# Patient Record
Sex: Female | Born: 1970 | Race: White | Hispanic: No | Marital: Married | Smoking: Former smoker
Health system: Southern US, Community
[De-identification: ages and names within clinical notes are randomized; demographics above are authoritative.]

## PROBLEM LIST (undated history)

## (undated) DIAGNOSIS — N189 Chronic kidney disease, unspecified: Secondary | ICD-10-CM

## (undated) DIAGNOSIS — E669 Obesity, unspecified: Secondary | ICD-10-CM

## (undated) DIAGNOSIS — K759 Inflammatory liver disease, unspecified: Secondary | ICD-10-CM

## (undated) DIAGNOSIS — K649 Unspecified hemorrhoids: Secondary | ICD-10-CM

## (undated) DIAGNOSIS — I10 Essential (primary) hypertension: Secondary | ICD-10-CM

## (undated) DIAGNOSIS — T8859XA Other complications of anesthesia, initial encounter: Secondary | ICD-10-CM

## (undated) DIAGNOSIS — D649 Anemia, unspecified: Secondary | ICD-10-CM

## (undated) DIAGNOSIS — R42 Dizziness and giddiness: Secondary | ICD-10-CM

## (undated) DIAGNOSIS — T4145XA Adverse effect of unspecified anesthetic, initial encounter: Secondary | ICD-10-CM

## (undated) DIAGNOSIS — F419 Anxiety disorder, unspecified: Secondary | ICD-10-CM

## (undated) HISTORY — PX: VESICOVAGINAL FISTULA CLOSURE: SUR270

## (undated) HISTORY — PX: CYSTOURETHROSCOPY: SHX476

## (undated) HISTORY — PX: COLONOSCOPY: SHX174

---

## 2006-12-21 ENCOUNTER — Ambulatory Visit: Payer: Self-pay | Admitting: Emergency Medicine

## 2006-12-26 ENCOUNTER — Ambulatory Visit: Payer: Self-pay | Admitting: Internal Medicine

## 2009-06-13 ENCOUNTER — Ambulatory Visit: Payer: Self-pay | Admitting: Family Medicine

## 2009-07-09 ENCOUNTER — Ambulatory Visit: Payer: Self-pay | Admitting: Internal Medicine

## 2011-06-02 ENCOUNTER — Ambulatory Visit: Payer: Self-pay | Admitting: Internal Medicine

## 2013-06-14 HISTORY — PX: ABDOMINAL HYSTERECTOMY: SHX81

## 2013-08-03 ENCOUNTER — Ambulatory Visit: Payer: Self-pay | Admitting: Internal Medicine

## 2014-01-23 ENCOUNTER — Ambulatory Visit: Payer: Self-pay | Admitting: Internal Medicine

## 2014-04-16 ENCOUNTER — Ambulatory Visit: Payer: Self-pay | Admitting: Obstetrics and Gynecology

## 2014-04-16 DIAGNOSIS — I10 Essential (primary) hypertension: Secondary | ICD-10-CM

## 2014-04-16 LAB — CBC
HCT: 35 % (ref 35.0–47.0)
HGB: 11.5 g/dL — ABNORMAL LOW (ref 12.0–16.0)
MCH: 25.4 pg — AB (ref 26.0–34.0)
MCHC: 32.9 g/dL (ref 32.0–36.0)
MCV: 77 fL — ABNORMAL LOW (ref 80–100)
Platelet: 226 10*3/uL (ref 150–440)
RBC: 4.53 10*6/uL (ref 3.80–5.20)
RDW: 15.9 % — ABNORMAL HIGH (ref 11.5–14.5)
WBC: 8.9 10*3/uL (ref 3.6–11.0)

## 2014-04-16 LAB — BASIC METABOLIC PANEL
Anion Gap: 6 — ABNORMAL LOW (ref 7–16)
BUN: 10 mg/dL (ref 7–18)
Calcium, Total: 8.8 mg/dL (ref 8.5–10.1)
Chloride: 103 mmol/L (ref 98–107)
Co2: 30 mmol/L (ref 21–32)
Creatinine: 0.73 mg/dL (ref 0.60–1.30)
EGFR (African American): >60
Glucose: 89 mg/dL (ref 65–99)
OSMOLALITY: 276 (ref 275–301)
POTASSIUM: 3.4 mmol/L — AB (ref 3.5–5.1)
Sodium: 139 mmol/L (ref 136–145)

## 2014-04-22 ENCOUNTER — Ambulatory Visit: Payer: Self-pay | Admitting: Obstetrics and Gynecology

## 2014-04-23 LAB — CREATININE, SERUM
CREATININE: 0.82 mg/dL (ref 0.60–1.30)
EGFR (African American): >60
EGFR (Non-African Amer.): >60

## 2014-04-23 LAB — HEMOGLOBIN: HGB: 9 g/dL — AB (ref 12.0–16.0)

## 2014-04-24 ENCOUNTER — Emergency Department: Payer: Self-pay | Admitting: Emergency Medicine

## 2014-04-24 LAB — URINALYSIS, COMPLETE
BILIRUBIN, UR: NEGATIVE
Bacteria: NONE SEEN
GLUCOSE, UR: NEGATIVE mg/dL (ref 0–75)
Leukocyte Esterase: NEGATIVE
Nitrite: POSITIVE
PH: 6 (ref 4.5–8.0)
Specific Gravity: 1.023 (ref 1.003–1.030)
Squamous Epithelial: <1

## 2014-04-24 LAB — CBC
HCT: 31.4 % — AB (ref 35.0–47.0)
HGB: 10.2 g/dL — ABNORMAL LOW (ref 12.0–16.0)
MCH: 25.3 pg — AB (ref 26.0–34.0)
MCHC: 32.6 g/dL (ref 32.0–36.0)
MCV: 77 fL — ABNORMAL LOW (ref 80–100)
Platelet: 222 10*3/uL (ref 150–440)
RBC: 4.06 10*6/uL (ref 3.80–5.20)
RDW: 15.7 % — ABNORMAL HIGH (ref 11.5–14.5)
WBC: 12 10*3/uL — ABNORMAL HIGH (ref 3.6–11.0)

## 2014-04-24 LAB — COMPREHENSIVE METABOLIC PANEL
ALBUMIN: 2.8 g/dL — AB (ref 3.4–5.0)
ALT: 21 U/L
AST: 23 U/L (ref 15–37)
Alkaline Phosphatase: 68 U/L
Anion Gap: 8 (ref 7–16)
BUN: 16 mg/dL (ref 7–18)
Bilirubin,Total: 0.4 mg/dL (ref 0.2–1.0)
Calcium, Total: 8.2 mg/dL — ABNORMAL LOW (ref 8.5–10.1)
Chloride: 106 mmol/L (ref 98–107)
Co2: 28 mmol/L (ref 21–32)
Creatinine: 1.61 mg/dL — ABNORMAL HIGH (ref 0.60–1.30)
EGFR (African American): 45 — ABNORMAL LOW
GFR CALC NON AF AMER: 37 — AB
GLUCOSE: 102 mg/dL — AB (ref 65–99)
Osmolality: 285 (ref 275–301)
POTASSIUM: 3.7 mmol/L (ref 3.5–5.1)
Sodium: 142 mmol/L (ref 136–145)
Total Protein: 6.5 g/dL (ref 6.4–8.2)

## 2014-04-26 ENCOUNTER — Ambulatory Visit: Payer: Self-pay | Admitting: Family Medicine

## 2014-04-27 ENCOUNTER — Emergency Department: Payer: Self-pay | Admitting: Internal Medicine

## 2014-04-27 LAB — URINALYSIS, COMPLETE
RBC,UR: 61 /HPF (ref 0–5)
SPECIFIC GRAVITY: 1.026 (ref 1.003–1.030)
Squamous Epithelial: 1
WBC UR: 11 /HPF (ref 0–5)

## 2014-09-18 ENCOUNTER — Ambulatory Visit
Admit: 2014-09-18 | Disposition: A | Discharge status: Home / Self Care | Payer: Self-pay | Attending: Obstetrics and Gynecology | Admitting: Obstetrics and Gynecology

## 2014-10-05 NOTE — Op Note (Signed)
PATIENT NAME:  Erica Aguilar, Erica Aguilar MR#:  [id number] DATE OF BIRTH:  06/11/1971  DATE OF PROCEDURE:  04/22/2014  PREOPERATIVE DIAGNOSES: 1.  Abnormal uterine bleeding.  2.  Leiomyoma.   POSTOPERATIVE DIAGNOSES: 1.  Abnormal uterine bleeding.  2.  Leiomyoma.   OPERATION:  Laparoscopic-assisted vaginal hysterectomy and bilateral salpingectomy.   ANESTHESIA:  General.   SURGEON:  Aj Crunkleton S. Valentino Saxonherry, MD   ASSISTANT:  Prentice DockerMartin A. DeFrancesco, MD   ESTIMATED BLOOD LOSS:  600 mL.   OPERATIVE FLUIDS:  2000 mL.   URINE OUTPUT:  150 mL (blood tinged at end of procedure).   COMPLICATIONS:  None.   FINDINGS:  The anterior fundal fibroid uterus was approximately 8 weeks' size. Normal-appearing ovaries and fallopian tubes bilaterally. Ureters were identified bilaterally with good peristalsis.   SPECIMEN:  Uterus, right tube, and left tube.   CONDITION:  Stable.   DESCRIPTION OF PROCEDURE:  The patient was taken to the operating room, where she was placed under general anesthesia without difficulty. She was then prepped and draped in the normal sterile fashion in the dorsal lithotomy position using Allen stirrups. Next, a Foley catheter was placed and allowed to drain to gravity with return of approximately 50 mL of clear urine. The Foley catheter was then clamped off using a Kelly clamp. Next, a speculum was placed into the vagina, and a single-tooth tenaculum was utilized to grasp the anterior lip of the uterine cervix. The uterus was sounded to 8 cm and a Hulka clamp was inserted for uterine manipulation. The single-tooth tenaculum was then removed from the patient's cervix, and the speculum was removed from the vagina. At this time, attention was turned to the abdomen, where a 5 mm infraumbilical vertical skin incision was made and the 5 mm trocar with sheath were then placed into the patient's abdomen under direct visualization with the laparoscope after confirmation of entrance into the abdomen was  made. The abdomen was insufflated using CO2 gas. After adequate insufflation, survey of the abdomen was performed. The abdomen and pelvis were noted to be free from adhesions. Next, a 5 mm skin incision was made and a 5 mm trocar was introduced into the abdominal cavity on the left side lateral to the rectus abdominis muscles under direct visualization. The same procedure was carried out on the patient's right side. Both fallopian tubes and ovaries appeared normal bilaterally. The cul-de-sac was clean without evidence of endometriosis or scarring or adhesions. The ureters were visualized and were noted to be deep in the pelvis. At this time, the right cornu was grasped, and the right fallopian tube was ligated using the Harmonic scalpel. Once the fallopian tube had been completely transected, this was removed through one of the 5 mm trocar sites. A similar procedure was carried out on the left with the left uterine cornu identified and the left fallopian tube being transected beginning at the fimbria and down to the level of the cornu. Once the fallopian tube had been transected, it too was removed through the 5 mm trocar site. Next, the round ligament and the utero-ovarian ligament were then ligated and transected using the Harmonic scalpel on the patient's left side and in a similar fashion was performed on the right side. The mesosalpinx and the cardinal ligament and the uterine vessels and the anterior and posterior sheaths of the broad ligament were then coagulated and transected in a serial fashion on either side of the uterus using the Harmonic scalpel. At the level of the  uterine artery, the Kleppinger device was also used to ensure adequate cautery of those vessels. The anterior leaf of the broad ligament was then dissected to the midline bilaterally, establishing a bladder flap with combination of blunt dissection and use of the Harmonic scalpel.   At this time, attention was made to the vaginal  hysterectomy. The laparoscope was removed from the patient's abdomen, and CO2 gas was placed in standby position. Next, the Hulka clamp was then removed from the cervix, and a double-tooth tenaculum was used to grasp the anterior and posterior lips of the cervix. Posterior colpotomy was accomplished using the Mayo scissors with sharp dissection without difficulty. The right uterosacral ligament was clamped, transected, and ligated with 0 Vicryl suture. Then, the left uterosacral ligament was clamped, transected, and ligated with 0 Vicryl suture. The parametrial tissue was then clamped bilaterally, transected, and ligated with 0 Vicryl suture bilaterally. After this, the anterior colpotomy was performed again with a combination of blunt and sharp dissection without difficulty. The remaining parametrial tissue was then clamped bilaterally, transected, and ligated with 0 Vicryl suture bilaterally. The uterus was then removed and passed off the operative field. A sponge stick was then placed into the patient's vagina, and the pedicles were evaluated. There was some bleeding noted on the left pedicle near the uterosacral ligament. Another suture of 0 Vicryl was placed for hemostasis. The sponge stick was then removed. The vaginal cuff was then closed in a running fashion with a 0 Vicryl suture. Hemostasis was noted throughout.   At this time, attention was then turned back to the abdomen, where the laparoscope was reinserted into the abdomen. The abdomen was reinsufflated. Evaluation revealed no further bleeding, however, the vaginal cuff was noted to be slightly oozy. Irrigation with normal saline was performed and again no active bleeding was noted; however, the vaginal cuff area did appear to be raw, so Arista was placed over the vaginal cuff, as well as the pedicles. Good hemostasis was noted at this time. Next, the two lateral trocar sheaths were then removed under laparoscopic visualization. The laparoscope was  then removed. The carbon dioxide was allowed to escape from the abdomen, and the infraumbilical trocar sheath was then removed. The skin incisions were closed using 4-0 Vicryl in a subcuticular fashion on the left and right lateral port sites. The incisions were injected with a total of approximately 10 mL of 1% lidocaine with epinephrine with 1:1 ratio.  All port sites were then covered using Dermabond. A dressing was applied after the Dermabond had been placed. The patient was awakened and taken to the recovery room in stable condition. Instrument, sponge, and needle counts were correct x 2 prior to the end of the procedure.    ____________________________ Jacques Earthly. Valentino Saxon, MD asc:nb D: 04/22/2014 22:05:56 ET T: 04/22/2014 22:33:11 ET JOB#: [id number]  cc: Jacques Earthly. Valentino Saxon, MD, <Dictator> Fabian November MD ELECTRONICALLY SIGNED 05/06/2014 9:59

## 2014-10-07 LAB — SURGICAL PATHOLOGY

## 2015-07-17 ENCOUNTER — Other Ambulatory Visit: Payer: Self-pay | Admitting: Internal Medicine

## 2015-07-17 DIAGNOSIS — R1011 Right upper quadrant pain: Secondary | ICD-10-CM

## 2015-07-24 ENCOUNTER — Ambulatory Visit
Admission: RE | Admit: 2015-07-24 | Discharge: 2015-07-24 | Disposition: A | Discharge status: Home / Self Care | Payer: BLUE CROSS/BLUE SHIELD | Source: Ambulatory Visit | Attending: Internal Medicine | Admitting: Internal Medicine

## 2015-07-24 DIAGNOSIS — K7689 Other specified diseases of liver: Secondary | ICD-10-CM | POA: Insufficient documentation

## 2015-07-24 DIAGNOSIS — R1011 Right upper quadrant pain: Secondary | ICD-10-CM | POA: Diagnosis not present

## 2015-07-25 ENCOUNTER — Other Ambulatory Visit: Payer: Self-pay | Admitting: Internal Medicine

## 2015-07-25 DIAGNOSIS — R1011 Right upper quadrant pain: Secondary | ICD-10-CM

## 2015-08-01 ENCOUNTER — Ambulatory Visit
Admission: RE | Admit: 2015-08-01 | Discharge: 2015-08-01 | Disposition: A | Discharge status: Home / Self Care | Payer: BLUE CROSS/BLUE SHIELD | Source: Ambulatory Visit | Attending: Internal Medicine | Admitting: Internal Medicine

## 2015-08-01 DIAGNOSIS — I10 Essential (primary) hypertension: Secondary | ICD-10-CM | POA: Insufficient documentation

## 2015-08-01 DIAGNOSIS — R1011 Right upper quadrant pain: Secondary | ICD-10-CM | POA: Diagnosis not present

## 2015-08-01 HISTORY — DX: Essential (primary) hypertension: I10

## 2015-08-01 MED ORDER — TECHNETIUM TC 99M MEBROFENIN IV KIT
5.4270 | PACK | Freq: Once | INTRAVENOUS | Status: AC | PRN
  Administered 2015-08-01: 5.427 via INTRAVENOUS

## 2015-08-08 ENCOUNTER — Encounter: Payer: Self-pay | Admitting: *Deleted

## 2015-08-08 ENCOUNTER — Other Ambulatory Visit: Payer: No Typology Code available for payment source

## 2015-08-08 NOTE — Patient Instructions (Signed)
  Your procedure is scheduled on: 08-14-15 (THURSDAY) Report to [facility] SAME [facility] SURGERY 2ND FLOOR To find out your arrival time please call [phone] between 1PM - 3PM on 08-13-15 [facility])  Remember: Instructions that are not followed completely may result in serious medical risk, up to and including death, or upon the discretion of your surgeon and anesthesiologist your surgery may need to be rescheduled.    _X___ 1. Do not eat food or drink liquids after midnight. No gum chewing or hard candies.     _X___ 2. No Alcohol for 24 hours before or after surgery.   ____ 3. Bring all medications with you on the day of surgery if instructed.    _X___ 4. Notify your doctor if there is any change in your medical condition     (cold, fever, infections).     Do not wear jewelry, make-up, hairpins, clips or nail polish.  Do not wear lotions, powders, or perfumes. You may wear deodorant.  Do not shave 48 hours prior to surgery. Men may shave face and neck.  Do not bring valuables to the hospital.    [facility] is not responsible for any belongings or valuables.               Contacts, dentures or bridgework may not be worn into surgery.  Leave your suitcase in the car. After surgery it may be brought to your room.  For patients admitted to the hospital, discharge time is determined by your treatment team.   Patients discharged the day of surgery will not be allowed to drive home.   Please read over the following fact sheets that you were given:     _X___ Take these medicines the morning of surgery with A SIP OF WATER:    1. LORAZEPAM  2. METOPROLOL  3.   4.  5.  6.  ____ Fleet Enema (as directed)   _X___ Use CHG Soap as directed  ____ Use inhalers on the day of surgery  ____ Stop metformin 2 days prior to surgery    ____ Take 1/2 of usual insulin dose the night before surgery and none on the morning of surgery.   ____ Stop Coumadin/Plavix/aspirin-N/A  ____ Stop  Anti-inflammatories-NO NSAIDS OR ASPIRIN PRODUCTS-TYLENOL OK TO TAKE   ____ Stop supplements until after surgery.    ____ Bring C-Pap to the hospital.

## 2015-08-12 ENCOUNTER — Encounter
Admission: RE | Admit: 2015-08-12 | Discharge: 2015-08-12 | Disposition: A | Discharge status: Home / Self Care | Payer: BLUE CROSS/BLUE SHIELD | Source: Ambulatory Visit | Attending: Surgery | Admitting: Surgery

## 2015-08-14 ENCOUNTER — Ambulatory Visit: Payer: BLUE CROSS/BLUE SHIELD | Admitting: Anesthesiology

## 2015-08-14 ENCOUNTER — Ambulatory Visit
Admission: RE | Admit: 2015-08-14 | Discharge: 2015-08-14 | Disposition: A | Discharge status: Home / Self Care | Payer: BLUE CROSS/BLUE SHIELD | Source: Ambulatory Visit | Attending: Surgery | Admitting: Surgery

## 2015-08-14 ENCOUNTER — Encounter: Payer: Self-pay | Admitting: *Deleted

## 2015-08-14 ENCOUNTER — Encounter
Admission: RE | Disposition: A | Discharge status: Home / Self Care | Payer: Self-pay | Source: Ambulatory Visit | Attending: Surgery

## 2015-08-14 ENCOUNTER — Ambulatory Visit: Payer: BLUE CROSS/BLUE SHIELD

## 2015-08-14 DIAGNOSIS — I10 Essential (primary) hypertension: Secondary | ICD-10-CM | POA: Insufficient documentation

## 2015-08-14 DIAGNOSIS — Z90710 Acquired absence of both cervix and uterus: Secondary | ICD-10-CM | POA: Insufficient documentation

## 2015-08-14 DIAGNOSIS — K811 Chronic cholecystitis: Secondary | ICD-10-CM | POA: Insufficient documentation

## 2015-08-14 DIAGNOSIS — Z6839 Body mass index (BMI) 39.0-39.9, adult: Secondary | ICD-10-CM | POA: Diagnosis not present

## 2015-08-14 DIAGNOSIS — Z800 Family history of malignant neoplasm of digestive organs: Secondary | ICD-10-CM | POA: Diagnosis not present

## 2015-08-14 DIAGNOSIS — Z9889 Other specified postprocedural states: Secondary | ICD-10-CM | POA: Insufficient documentation

## 2015-08-14 DIAGNOSIS — F419 Anxiety disorder, unspecified: Secondary | ICD-10-CM | POA: Diagnosis not present

## 2015-08-14 DIAGNOSIS — Z833 Family history of diabetes mellitus: Secondary | ICD-10-CM | POA: Insufficient documentation

## 2015-08-14 DIAGNOSIS — Z87440 Personal history of urinary (tract) infections: Secondary | ICD-10-CM | POA: Diagnosis not present

## 2015-08-14 DIAGNOSIS — Z8349 Family history of other endocrine, nutritional and metabolic diseases: Secondary | ICD-10-CM | POA: Diagnosis not present

## 2015-08-14 DIAGNOSIS — Z79899 Other long term (current) drug therapy: Secondary | ICD-10-CM | POA: Diagnosis not present

## 2015-08-14 DIAGNOSIS — J302 Other seasonal allergic rhinitis: Secondary | ICD-10-CM | POA: Insufficient documentation

## 2015-08-14 DIAGNOSIS — Z888 Allergy status to other drugs, medicaments and biological substances status: Secondary | ICD-10-CM | POA: Diagnosis not present

## 2015-08-14 DIAGNOSIS — K8010 Calculus of gallbladder with chronic cholecystitis without obstruction: Secondary | ICD-10-CM | POA: Diagnosis present

## 2015-08-14 DIAGNOSIS — Z8249 Family history of ischemic heart disease and other diseases of the circulatory system: Secondary | ICD-10-CM | POA: Insufficient documentation

## 2015-08-14 DIAGNOSIS — K819 Cholecystitis, unspecified: Secondary | ICD-10-CM

## 2015-08-14 HISTORY — DX: Dizziness and giddiness: R42

## 2015-08-14 HISTORY — DX: Anxiety disorder, unspecified: F41.9

## 2015-08-14 HISTORY — PX: CHOLECYSTECTOMY: SHX55

## 2015-08-14 HISTORY — DX: Anemia, unspecified: D64.9

## 2015-08-14 HISTORY — DX: Unspecified hemorrhoids: K64.9

## 2015-08-14 HISTORY — DX: Adverse effect of unspecified anesthetic, initial encounter: T41.45XA

## 2015-08-14 HISTORY — DX: Other complications of anesthesia, initial encounter: T88.59XA

## 2015-08-14 HISTORY — DX: Chronic kidney disease, unspecified: N18.9

## 2015-08-14 HISTORY — DX: Inflammatory liver disease, unspecified: K75.9

## 2015-08-14 HISTORY — DX: Obesity, unspecified: E66.9

## 2015-08-14 SURGERY — LAPAROSCOPIC CHOLECYSTECTOMY
Anesthesia: General | Wound class: Clean Contaminated

## 2015-08-14 MED ORDER — HYDROCODONE-ACETAMINOPHEN 5-325 MG PO TABS
1.0000 | ORAL_TABLET | ORAL | Status: DC | PRN
  Administered 2015-08-14: 1 via ORAL

## 2015-08-14 MED ORDER — FENTANYL CITRATE (PF) 100 MCG/2ML IJ SOLN
INTRAMUSCULAR | Status: AC
  Administered 2015-08-14: 25 ug via INTRAVENOUS
  Filled 2015-08-14: qty 2

## 2015-08-14 MED ORDER — PROMETHAZINE HCL 25 MG/ML IJ SOLN
6.2500 mg | INTRAMUSCULAR | Status: DC | PRN
  Administered 2015-08-14: 12.5 mg via INTRAVENOUS

## 2015-08-14 MED ORDER — FENTANYL CITRATE (PF) 100 MCG/2ML IJ SOLN
25.0000 ug | INTRAMUSCULAR | Status: DC | PRN
  Administered 2015-08-14 (×4): 25 ug via INTRAVENOUS

## 2015-08-14 MED ORDER — LACTATED RINGERS IV SOLN
INTRAVENOUS | Status: DC
  Administered 2015-08-14: 50 mL/h via INTRAVENOUS
  Administered 2015-08-14: 11:00:00 via INTRAVENOUS
  Administered 2015-08-14: 50 mL/h via INTRAVENOUS

## 2015-08-14 MED ORDER — FAMOTIDINE 20 MG PO TABS
ORAL_TABLET | ORAL | Status: AC
  Administered 2015-08-14: 20 mg via ORAL
  Filled 2015-08-14: qty 1

## 2015-08-14 MED ORDER — ACETAMINOPHEN 10 MG/ML IV SOLN
INTRAVENOUS | Status: DC | PRN
  Administered 2015-08-14: 1000 mg via INTRAVENOUS

## 2015-08-14 MED ORDER — ROCURONIUM BROMIDE 100 MG/10ML IV SOLN
INTRAVENOUS | Status: DC | PRN
  Administered 2015-08-14: 40 mg via INTRAVENOUS
  Administered 2015-08-14: 10 mg via INTRAVENOUS
  Administered 2015-08-14: 20 mg via INTRAVENOUS

## 2015-08-14 MED ORDER — ONDANSETRON HCL 4 MG/2ML IJ SOLN
INTRAMUSCULAR | Status: DC | PRN
  Administered 2015-08-14: 4 mg via INTRAVENOUS

## 2015-08-14 MED ORDER — SUGAMMADEX SODIUM 200 MG/2ML IV SOLN
INTRAVENOUS | Status: DC | PRN
  Administered 2015-08-14: 216 mg via INTRAVENOUS

## 2015-08-14 MED ORDER — SUCCINYLCHOLINE CHLORIDE 20 MG/ML IJ SOLN
INTRAMUSCULAR | Status: DC | PRN
  Administered 2015-08-14: 100 mg via INTRAVENOUS

## 2015-08-14 MED ORDER — FENTANYL CITRATE (PF) 250 MCG/5ML IJ SOLN
INTRAMUSCULAR | Status: DC | PRN
  Administered 2015-08-14 (×2): 50 ug via INTRAVENOUS

## 2015-08-14 MED ORDER — LIDOCAINE HCL (CARDIAC) 20 MG/ML IV SOLN
INTRAVENOUS | Status: DC | PRN
  Administered 2015-08-14: 100 mg via INTRAVENOUS

## 2015-08-14 MED ORDER — SODIUM CHLORIDE 0.9 % IV SOLN
INTRAVENOUS | Status: DC | PRN
  Administered 2015-08-14: 14 mL

## 2015-08-14 MED ORDER — PROMETHAZINE HCL 25 MG/ML IJ SOLN
INTRAMUSCULAR | Status: AC
  Filled 2015-08-14: qty 1

## 2015-08-14 MED ORDER — SODIUM CHLORIDE 0.9 % IV SOLN
INTRAVENOUS | Status: DC | PRN
  Administered 2015-08-14: 500 mL via INTRAMUSCULAR

## 2015-08-14 MED ORDER — ACETAMINOPHEN 10 MG/ML IV SOLN
INTRAVENOUS | Status: AC
  Filled 2015-08-14: qty 100

## 2015-08-14 MED ORDER — PROPOFOL 10 MG/ML IV BOLUS
INTRAVENOUS | Status: DC | PRN
  Administered 2015-08-14: 200 mg via INTRAVENOUS

## 2015-08-14 MED ORDER — DEXAMETHASONE SODIUM PHOSPHATE 10 MG/ML IJ SOLN
INTRAMUSCULAR | Status: DC | PRN
  Administered 2015-08-14: 10 mg via INTRAVENOUS

## 2015-08-14 MED ORDER — FAMOTIDINE 20 MG PO TABS
20.0000 mg | ORAL_TABLET | Freq: Once | ORAL | Status: AC
  Administered 2015-08-14: 20 mg via ORAL

## 2015-08-14 MED ORDER — HYDROCODONE-ACETAMINOPHEN 5-325 MG PO TABS
ORAL_TABLET | ORAL | Status: AC
  Administered 2015-08-14: 1 via ORAL
  Filled 2015-08-14: qty 1

## 2015-08-14 MED ORDER — HEPARIN SODIUM (PORCINE) 5000 UNIT/ML IJ SOLN
INTRAMUSCULAR | Status: AC
  Filled 2015-08-14: qty 1

## 2015-08-14 SURGICAL SUPPLY — 38 items
APPLIER CLIP ROT 10 11.4 M/L (STAPLE) ×3
CANISTER SUCT 1200ML W/VALVE (MISCELLANEOUS) ×3 IMPLANT
CANNULA DILATOR 10 W/SLV (CANNULA) ×2 IMPLANT
CANNULA DILATOR 10MM W/SLV (CANNULA) ×1
CATH REDDICK CHOLANGI 4FR 50CM (CATHETERS) ×3 IMPLANT
CHLORAPREP W/TINT 26ML (MISCELLANEOUS) ×3 IMPLANT
CLIP APPLIE ROT 10 11.4 M/L (STAPLE) ×1 IMPLANT
CLOSURE WOUND 1/2 X4 (GAUZE/BANDAGES/DRESSINGS) ×1
DRAPE SHEET LG 3/4 BI-LAMINATE (DRAPES) ×3 IMPLANT
ELECT REM PT RETURN 9FT ADLT (ELECTROSURGICAL) ×3
ELECTRODE REM PT RTRN 9FT ADLT (ELECTROSURGICAL) ×1 IMPLANT
GAUZE SPONGE 4X4 12PLY STRL (GAUZE/BANDAGES/DRESSINGS) ×3 IMPLANT
GLOVE BIO SURGEON STRL SZ7.5 (GLOVE) ×21 IMPLANT
GOWN STRL REUS W/ TWL LRG LVL3 (GOWN DISPOSABLE) ×4 IMPLANT
GOWN STRL REUS W/TWL LRG LVL3 (GOWN DISPOSABLE) ×8
IRRIGATION STRYKERFLOW (MISCELLANEOUS) ×1 IMPLANT
IRRIGATOR STRYKERFLOW (MISCELLANEOUS) ×3
IV NS 1000ML (IV SOLUTION) ×2
IV NS 1000ML BAXH (IV SOLUTION) ×1 IMPLANT
KIT RM TURNOVER STRD PROC AR (KITS) ×3 IMPLANT
LABEL OR SOLS (LABEL) ×3 IMPLANT
LOOP SUT CHROMIC 0 SGL3 (SUTURE) ×3 IMPLANT
NDL INSUFF ACCESS 14 VERSASTEP (NEEDLE) ×3 IMPLANT
NEEDLE FILTER BLUNT 18X 1/2SAF (NEEDLE) ×2
NEEDLE FILTER BLUNT 18X1 1/2 (NEEDLE) ×1 IMPLANT
NS IRRIG 500ML POUR BTL (IV SOLUTION) ×3 IMPLANT
PACK LAP CHOLECYSTECTOMY (MISCELLANEOUS) ×3 IMPLANT
SCISSORS METZENBAUM CVD 33 (INSTRUMENTS) ×3 IMPLANT
SEAL FOR SCOPE WARMER C3101 (MISCELLANEOUS) IMPLANT
SLEEVE ENDOPATH XCEL 5M (ENDOMECHANICALS) ×3 IMPLANT
STRIP CLOSURE SKIN 1/2X4 (GAUZE/BANDAGES/DRESSINGS) ×2 IMPLANT
SUT CHROMIC 5 0 RB 1 27 (SUTURE) ×3 IMPLANT
SUT VIC AB 0 CT2 27 (SUTURE) IMPLANT
SYR 3ML LL SCALE MARK (SYRINGE) ×3 IMPLANT
TROCAR XCEL NON-BLD 11X100MML (ENDOMECHANICALS) ×3 IMPLANT
TROCAR XCEL NON-BLD 5MMX100MML (ENDOMECHANICALS) ×3 IMPLANT
TUBING INSUFFLATOR HI FLOW (MISCELLANEOUS) ×3 IMPLANT
WATER STERILE IRR 1000ML POUR (IV SOLUTION) IMPLANT

## 2015-08-14 NOTE — Op Note (Signed)
OPERATIVE REPORT  PREOPERATIVE DIAGNOSIS:  Chronic cholecystitis   POSTOPERATIVE DIAGNOSIS: Chronic cholecystitis cholelithiasis  PROCEDURE: Laparoscopic cholecystectomy with cholangiogram  ANESTHESIA: General  SURGEON: Renda Rolls M.D.  INDICATIONS: She has a history of episodes of right upper quadrant pain. She had an abnormally low gallbladder ejection fraction of 10%. Surgery was recommended for definitive treatment.    With the patient on the operating table in the supine position under general endotracheal anesthesia the abdomen was prepared with ChloraPrep solution and draped in a sterile manner. A short incision was made in the inferior aspect of the umbilicus and carried down to the deep fascia which was grasped with a laryngeal hook. A Veress needle was inserted aspirated and irrigated with a saline solution. The peritoneal cavity was insufflated with carbon dioxide. The Veress needle was removed. The 10 mm cannula was inserted. The 10 mm 0 laparoscope was inserted to view the peritoneal cavity.  Another incision was made in the epigastrium slightly to the right of the midline to introduce an 11 mm cannula. 2 incisions were made in the lateral aspect of the right upper quadrant to introduce 2   5 mm cannulas. Initial inspection revealed omentum was adherent to the liver and gallbladder so that only a small portion of the gallbladder could be identified.The gallbladder was retracted towards the right shoulder. Multiple adhesions were taken down with a very tedious dissection. Several small bleeding points were cauterized. Dissection was carried out with a combination of blunt dissection with the microdissector and hook and cautery. As the omentum was peeled away from the gallbladder, the gallbladder neck was retracted. Circumferential dissection was carried out around the gallbladder neck and identified the cystic duct. The cystic artery was in continuity with the cystic duct. The  gallbladder neck was retracted inferiorly and laterally.  The porta hepatis was identified. The gallbladder was mobilized with incision of the visceral peritoneum. The cystic duct was dissected free from surrounding structures. . A critical view of safety was demonstrated  An Endo Clip was placed across the cystic duct adjacent to the gallbladder neck. An incision was made in the cystic duct to introduce a Reddick catheter. 2 small stones came out of the cystic duct and were retrieved with the stone scoop. The cholangiogram was done with injection of half-strength Conray 60 dye. This demonstrated the bile ducts and flow of dye into the duodenum. No retained stones were identified. The cholangiogram appeared normal. The Reddick catheter was removed. The cystic duct and cystic artery were doubly ligated with endoclips and divided. The cystic duct and cystic artery were surgically also ligated with a 0 chromic Endoloop. The gallbladder was dissected free from the liver with use of hook and cautery and blunt dissection. Bleeding was minimal and hemostasis was intact. The site was irrigated with heparinized saline solution and aspirated. Hemostasis was intact. The gallbladder was delivered up through the infraumbilical incision opened and suctioned.  The gallbladder was removed and submitted for routine pathology. The right upper quadrant was further inspected and irrigated and aspirated and couldn't further seeing hemostasis was intact. The cannulas removed allowing carbon dioxide to escape from the peritoneal cavity.The skin incisions were closed with interrupted 5-0 chromic subcutaneous suture benzoin and Steri-Strips. Gauze dressings were applied with paper tape.  The patient appeared to be in satisfactory condition and was prepared for transfer to the recovery room  This operation was more difficult than the typical gallbladder surgery due to morbid obesity and extensive adhesions surrounding the gallbladder  requiring some 2 hours and 16 minutes to do the surgery.  Renda Rolls M.D.

## 2015-08-14 NOTE — Anesthesia Preprocedure Evaluation (Signed)
Anesthesia Evaluation  Patient identified by MRN, date of birth, ID band Patient awake    Reviewed: Allergy & Precautions, H&P , NPO status , Patient's Chart, lab work & pertinent test results, reviewed documented beta blocker date and time   History of Anesthesia Complications Negative for: history of anesthetic complications  Airway Mallampati: III  TM Distance: >3 FB Neck ROM: full    Dental no notable dental hx. (+) Caps, Chipped   Pulmonary neg shortness of breath, sleep apnea (likely based on history) , neg COPD, neg recent URI, former smoker,    Pulmonary exam normal breath sounds clear to auscultation       Cardiovascular Exercise Tolerance: Good hypertension, On Medications and On Home Beta Blockers (-) angina(-) CAD, (-) Past MI, (-) Cardiac Stents and (-) CABG Normal cardiovascular exam(-) dysrhythmias (-) Valvular Problems/Murmurs Rhythm:regular Rate:Normal     Neuro/Psych negative neurological ROS  negative psych ROS   GI/Hepatic negative GI ROS, Neg liver ROS,   Endo/Other  neg diabetesMorbid obesity  Renal/GU Renal disease (kidney stones)  negative genitourinary   Musculoskeletal   Abdominal   Peds  Hematology negative hematology ROS (+)   Anesthesia Other Findings Past Medical History:   Hypertension                                                   Comment:Per patient   Anxiety                                                      Hemorrhoids                                                  Obesity                                                      Vertigo                                                      Chronic kidney disease                                         Comment:H/O KIDNEY STONES   Complication of anesthesia                                     Comment:RIGHT UPPER INCISOR CHIPPED DURING SURGERY-PT               STATES SHE "STOPPED BREATHING IN PACU" AND THE  NURSE  HAD TO REMIND PT TO BREATHE.    Hepatitis                                                      Comment:A IN HIGHSCHOOL   Anemia                                                         Comment:prior to hysterectomy. resolved now   Reproductive/Obstetrics negative OB ROS                             Anesthesia Physical Anesthesia Plan  ASA: III  Anesthesia Plan: General   Post-op Pain Management:    Induction:   Airway Management Planned:   Additional Equipment:   Intra-op Plan:   Post-operative Plan:   Informed Consent: I have reviewed the patients History and Physical, chart, labs and discussed the procedure including the risks, benefits and alternatives for the proposed anesthesia with the patient or authorized representative who has indicated his/her understanding and acceptance.   Dental Advisory Given  Plan Discussed with: Anesthesiologist, CRNA and Surgeon  Anesthesia Plan Comments:         Anesthesia Quick Evaluation

## 2015-08-14 NOTE — Anesthesia Procedure Notes (Signed)
Procedure Name: Intubation Date/Time: 08/14/2015 9:02 AM Performed by: Chong Sicilian Pre-anesthesia Checklist: Patient identified, Emergency Drugs available, Suction available, Patient being monitored and Timeout performed Patient Re-evaluated:Patient Re-evaluated prior to inductionOxygen Delivery Method: Circle system utilized Preoxygenation: Pre-oxygenation with 100% oxygen Intubation Type: IV induction Ventilation: Mask ventilation without difficulty Laryngoscope Size: Mac and 3 Grade View: Grade I Tube type: Oral Tube size: 7.0 mm Number of attempts: 1 Airway Equipment and Method: Stylet Placement Confirmation: ETT inserted through vocal cords under direct vision,  positive ETCO2 and breath sounds checked- equal and bilateral Secured at: 20 cm Tube secured with: Tape Dental Injury: Teeth and Oropharynx as per pre-operative assessment

## 2015-08-14 NOTE — Anesthesia Postprocedure Evaluation (Signed)
Anesthesia Post Note  Patient: Erica Aguilar  Procedure(s) Performed: Procedure(s) (LRB): LAPAROSCOPIC CHOLECYSTECTOMY (N/A)  Patient location during evaluation: PACU Anesthesia Type: General Level of consciousness: awake and alert Pain management: pain level controlled Vital Signs Assessment: post-procedure vital signs reviewed and stable Respiratory status: spontaneous breathing, nonlabored ventilation, respiratory function stable and patient connected to nasal cannula oxygen Cardiovascular status: blood pressure returned to baseline and stable Postop Assessment: no signs of nausea or vomiting Anesthetic complications: no    Last Vitals:  Filed Vitals:   08/14/15 1215 08/14/15 1230  BP: 146/82 134/81  Pulse: 100 99  Temp:  36.3 C  Resp: 17 17    Last Pain:  Filed Vitals:   08/14/15 1235  PainSc: 5                  Lenard Simmer

## 2015-08-14 NOTE — OR Nursing (Signed)
Patient arrived in sds wearing a mask over her face. States she has had a sore throat over last week but went to see her pcp yesterday.  Dr. Judithann Sheen evaluated her and noticed swollen tonsils and gave her rocephin im along with po doxycycline to take bid x 10 days.  Denies fevers, afebrile today...denies coughing.  Discussed with dr. Karlton Lemon and he felt okay to proceed. Note from dr. Judithann Sheen also states that she is good for surgery.  Patient states she feels okay and would like to move ahead today.  i informed patient that dr. Katrinka Blazing would have the ultimate decision regarding proceeding with surgery.

## 2015-08-14 NOTE — H&P (Signed)
  She has had a sore throat and was seen by her primary care physician told she can have surgery today.  No attacks since office visit.  Discussed plan for lap cholecystectomy

## 2015-08-14 NOTE — Discharge Instructions (Signed)
Take Tylenol or acetaminophen with hydrocodone if needed for pain.  Remove dressings on Friday. May shower Saturday.  Avoid straining and heavy lifting for 1 week after surgery.  AMBULATORY SURGERY  DISCHARGE INSTRUCTIONS   1) The drugs that you were given will stay in your system until tomorrow so for the next 24 hours you should not:  A) Drive an automobile B) Make any legal decisions C) Drink any alcoholic beverage   2) You may resume regular meals tomorrow.  Today it is better to start with liquids and gradually work up to solid foods.  You may eat anything you prefer, but it is better to start with liquids, then soup and crackers, and gradually work up to solid foods.   3) Please notify your doctor immediately if you have any unusual bleeding, trouble breathing, redness and pain at the surgery site, drainage, fever, or pain not relieved by medication.    4) Additional Instructions:        Please contact your physician with any problems or Same Day Surgery at [phone], Monday through Friday 6 am to 4 pm, or [place] at [facility] number at [phone].AMBULATORY SURGERY  DISCHARGE INSTRUCTIONS   5) The drugs that you were given will stay in your system until tomorrow so for the next 24 hours you should not:  D) Drive an automobile E) Make any legal decisions F) Drink any alcoholic beverage   6) You may resume regular meals tomorrow.  Today it is better to start with liquids and gradually work up to solid foods.  You may eat anything you prefer, but it is better to start with liquids, then soup and crackers, and gradually work up to solid foods.   7) Please notify your doctor immediately if you have any unusual bleeding, trouble breathing, redness and pain at the surgery site, drainage, fever, or pain not relieved by medication.    8) Additional Instructions:        Please contact your physician with any problems or Same Day Surgery at  [phone], Monday through Friday 6 am to 4 pm, or [place] at [facility] number at [phone].AMBULATORY SURGERY  DISCHARGE INSTRUCTIONS   9) The drugs that you were given will stay in your system until tomorrow so for the next 24 hours you should not:  G) Drive an automobile H) Make any legal decisions I) Drink any alcoholic beverage   10) You may resume regular meals tomorrow.  Today it is better to start with liquids and gradually work up to solid foods.  You may eat anything you prefer, but it is better to start with liquids, then soup and crackers, and gradually work up to solid foods.   11) Please notify your doctor immediately if you have any unusual bleeding, trouble breathing, redness and pain at the surgery site, drainage, fever, or pain not relieved by medication.    12) Additional Instructions:        Please contact your physician with any problems or Same Day Surgery at [phone], Monday through Friday 6 am to 4 pm, or [place] at [facility] number at [phone].

## 2015-08-14 NOTE — Transfer of Care (Signed)
Immediate Anesthesia Transfer of Care Note  Patient: Erica Aguilar  Procedure(s) Performed: Procedure(s): LAPAROSCOPIC CHOLECYSTECTOMY (N/A)  Patient Location: PACU  Anesthesia Type:General  Level of Consciousness: sedated  Airway & Oxygen Therapy: Patient Spontanous Breathing and Patient connected to face mask oxygen  Post-op Assessment: Report given to RN and Post -op Vital signs reviewed and stable  Post vital signs: Reviewed and stable  Last Vitals:  Filed Vitals:   08/14/15 0747 08/14/15 1144  BP: 156/89 151/83  Pulse: 93 106  Temp: 37 C 37.3 C  Resp: 16 20    Complications: No apparent anesthesia complications

## 2015-08-15 LAB — SURGICAL PATHOLOGY

## 2016-08-09 IMAGING — NM NM HEPATO W/GB/PHARM/[PERSON_NAME]
3 series · 18 of 18 positions shown · non-contrast
Comparison: Gallbladder ultrasound 07/24/2015.

CLINICAL DATA: recurrent right upper quadrant pain starting
[DATE] [DATE]

EXAM:
NUCLEAR MEDICINE HEPATOBILIARY IMAGING WITH GALLBLADDER EF
TECHNIQUE: Sequential images of the abdomen were obtained [DATE] minutes
following intravenous administration of radiopharmaceutical. After
slow intravenous infusion of 2.16 micrograms Cholecystokinin,
gallbladder ejection fraction was determined.
RADIOPHARMACEUTICALS:  5.4 mCi Pc-SSm Choletec IV

[Series 1000: gallbladder ef (results) · 3.90mm/px · 6 of 120 frames shown]
[frame 11/120]
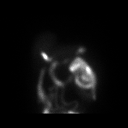
[frame 31/120]
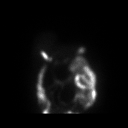
[frame 51/120]
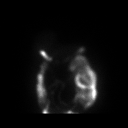
[frame 71/120]
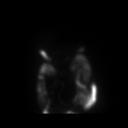
[frame 91/120]
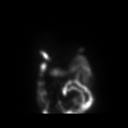
[frame 111/120]
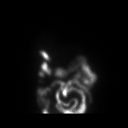

[Series 1000: gallbladder ef · 3.90mm/px · 6 of 120 frames shown]
[frame 11/120]
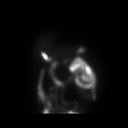
[frame 31/120]
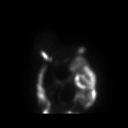
[frame 51/120]
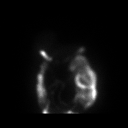
[frame 71/120]
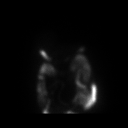
[frame 91/120]
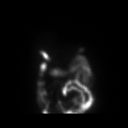
[frame 111/120]
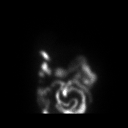

[Series 1000: hepatobiliary scan · 9.59mm/px · 6 of 60 frames shown]
[frame 6/60]
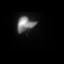
[frame 16/60]
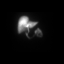
[frame 26/60]
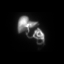
[frame 36/60]
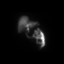
[frame 46/60]
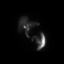
[frame 56/60]
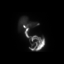

[18 of 18 positions shown; findings below may reference images not displayed]

FINDINGS: There is normal uptake of the tracer by the liver. CBD noted at 13
minutes. Bowel activity noted at 17 minutes. Gallbladder visualized
at 43 minutes. Post CCK gallbladder ejection fraction is only 10%.
At 60 min, normal ejection fraction is greater than 40%.
IMPRESSION: No cystic duct obstruction. No CBD obstruction. Post CCK gallbladder
ejection fraction is only 10%.

The patient reported no symptoms during the study.

Please note the tech reported difficult study due to intermittent IV
malfunction. However images taken 15 minutes after IV functioning
properly.

## 2022-01-06 ENCOUNTER — Other Ambulatory Visit: Payer: Self-pay | Admitting: Internal Medicine

## 2022-01-06 DIAGNOSIS — Z1231 Encounter for screening mammogram for malignant neoplasm of breast: Secondary | ICD-10-CM

## 2022-02-03 ENCOUNTER — Ambulatory Visit
Admission: RE | Admit: 2022-02-03 | Discharge: 2022-02-03 | Disposition: A | Discharge status: Home / Self Care | Payer: 59 | Source: Ambulatory Visit | Attending: Internal Medicine | Admitting: Internal Medicine

## 2022-02-03 DIAGNOSIS — Z1231 Encounter for screening mammogram for malignant neoplasm of breast: Secondary | ICD-10-CM | POA: Insufficient documentation

## 2022-02-08 ENCOUNTER — Inpatient Hospital Stay
Admission: RE | Admit: 2022-02-08 | Discharge: 2022-02-08 | Disposition: A | Discharge status: Home / Self Care | Payer: Self-pay | Source: Ambulatory Visit | Attending: *Deleted | Admitting: *Deleted

## 2022-02-08 ENCOUNTER — Other Ambulatory Visit: Payer: Self-pay | Admitting: *Deleted

## 2022-02-08 DIAGNOSIS — Z1231 Encounter for screening mammogram for malignant neoplasm of breast: Secondary | ICD-10-CM

## 2022-11-25 ENCOUNTER — Other Ambulatory Visit: Payer: Self-pay | Admitting: Internal Medicine

## 2022-11-25 DIAGNOSIS — Z1231 Encounter for screening mammogram for malignant neoplasm of breast: Secondary | ICD-10-CM

## 2023-06-29 ENCOUNTER — Ambulatory Visit
Admission: RE | Admit: 2023-06-29 | Discharge: 2023-06-29 | Disposition: A | Discharge status: Home / Self Care | Payer: Medicaid Other | Source: Ambulatory Visit | Attending: Internal Medicine | Admitting: Internal Medicine

## 2023-06-29 DIAGNOSIS — Z1231 Encounter for screening mammogram for malignant neoplasm of breast: Secondary | ICD-10-CM | POA: Insufficient documentation
